# Patient Record
Sex: Male | Born: 1980 | Race: Black or African American | Hispanic: No | Marital: Single | Smoking: Never smoker
Health system: Southern US, Community
[De-identification: ages and names within clinical notes are randomized; demographics above are authoritative.]

## PROBLEM LIST (undated history)

## (undated) DIAGNOSIS — T7840XA Allergy, unspecified, initial encounter: Secondary | ICD-10-CM

## (undated) DIAGNOSIS — J45909 Unspecified asthma, uncomplicated: Secondary | ICD-10-CM

## (undated) DIAGNOSIS — D571 Sickle-cell disease without crisis: Secondary | ICD-10-CM

## (undated) HISTORY — DX: Allergy, unspecified, initial encounter: T78.40XA

## (undated) HISTORY — DX: Unspecified asthma, uncomplicated: J45.909

## (undated) HISTORY — DX: Sickle-cell disease without crisis: D57.1

---

## 2002-04-19 ENCOUNTER — Emergency Department (HOSPITAL_COMMUNITY): Admission: EM | Admit: 2002-04-19 | Discharge: 2002-04-19 | Payer: Self-pay | Admitting: Emergency Medicine

## 2002-05-16 ENCOUNTER — Emergency Department (HOSPITAL_COMMUNITY): Admission: EM | Admit: 2002-05-16 | Discharge: 2002-05-16 | Payer: Self-pay | Admitting: Emergency Medicine

## 2004-06-27 ENCOUNTER — Emergency Department (HOSPITAL_COMMUNITY): Admission: EM | Admit: 2004-06-27 | Discharge: 2004-06-27 | Payer: Self-pay | Admitting: Emergency Medicine

## 2006-01-01 ENCOUNTER — Inpatient Hospital Stay (HOSPITAL_COMMUNITY): Admission: EM | Admit: 2006-01-01 | Discharge: 2006-01-06 | Payer: Self-pay | Admitting: Emergency Medicine

## 2006-03-29 ENCOUNTER — Emergency Department (HOSPITAL_COMMUNITY): Admission: EM | Admit: 2006-03-29 | Discharge: 2006-03-29 | Payer: Self-pay | Admitting: Emergency Medicine

## 2006-05-28 ENCOUNTER — Inpatient Hospital Stay (HOSPITAL_COMMUNITY): Admission: EM | Admit: 2006-05-28 | Discharge: 2006-05-31 | Payer: Self-pay | Admitting: *Deleted

## 2006-08-06 ENCOUNTER — Emergency Department (HOSPITAL_COMMUNITY): Admission: EM | Admit: 2006-08-06 | Discharge: 2006-08-06 | Payer: Self-pay | Admitting: Emergency Medicine

## 2006-11-16 ENCOUNTER — Emergency Department (HOSPITAL_COMMUNITY): Admission: EM | Admit: 2006-11-16 | Discharge: 2006-11-16 | Payer: Self-pay | Admitting: Emergency Medicine

## 2006-11-26 ENCOUNTER — Emergency Department (HOSPITAL_COMMUNITY): Admission: EM | Admit: 2006-11-26 | Discharge: 2006-11-26 | Payer: Self-pay | Admitting: Emergency Medicine

## 2007-03-10 ENCOUNTER — Emergency Department (HOSPITAL_COMMUNITY): Admission: EM | Admit: 2007-03-10 | Discharge: 2007-03-10 | Payer: Self-pay | Admitting: Emergency Medicine

## 2007-08-29 IMAGING — CT CT PARANASAL SINUSES LIMITED
1 series · 16 of 24 positions shown, 20 images · non-contrast
Comparison: None available.

CLINICAL DATA: Pneumonia and Sickle cell crisis.  Evaluate for sinus disease.  
 LIMITED CT OF PARANASAL SINUSES:
TECHNIQUE: Limited coronal CT images were obtained through the paranasal sinuses without intravenous contrast.

[Series 3: ltd sinuses 3.0 h40s · axial · 0.29mm/px · z∈[+1212,+1315]mm · 16 of 24 slices shown, 20 images]
[im 2/24  brain]
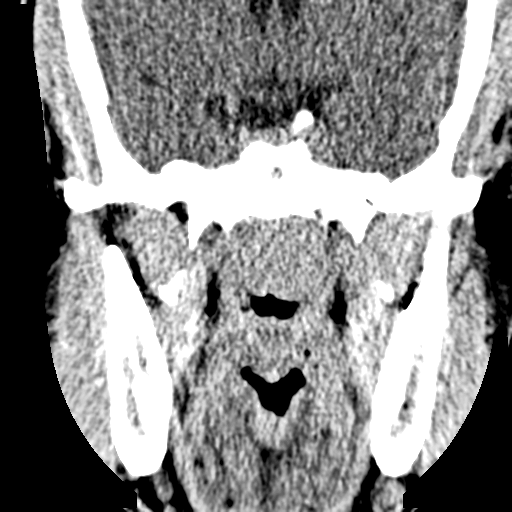
[im 2/24  bone]
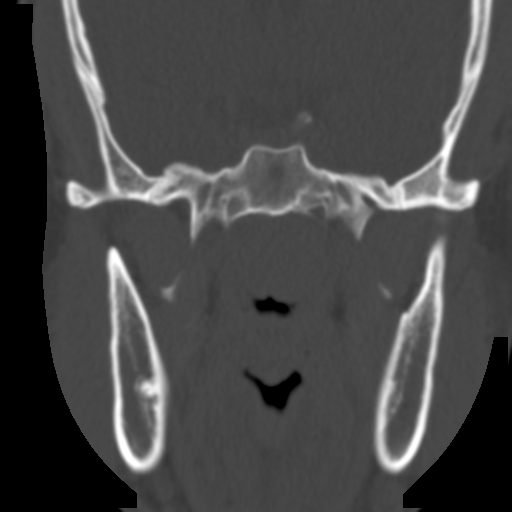
[im 4/24  bone]
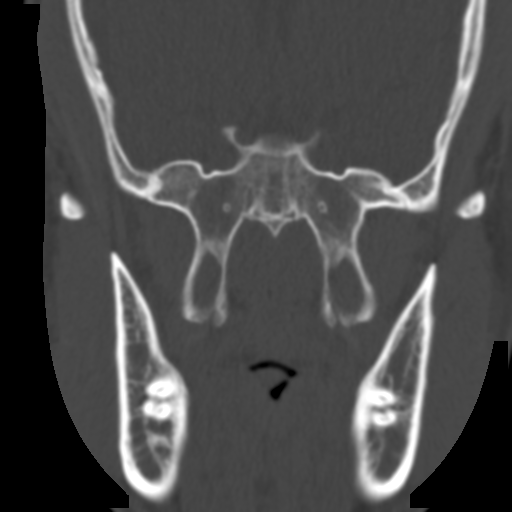
[im 5/24  bone]
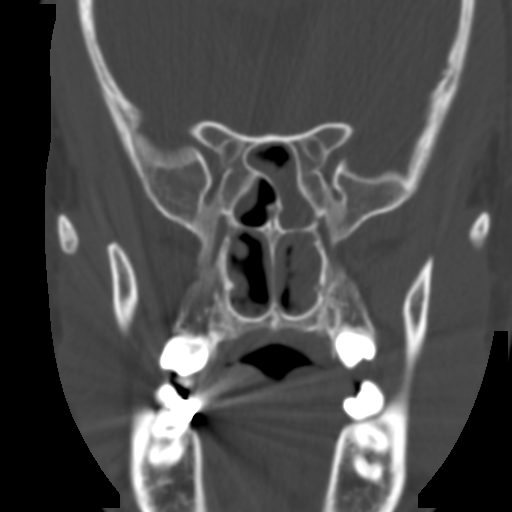
[im 6/24  bone]
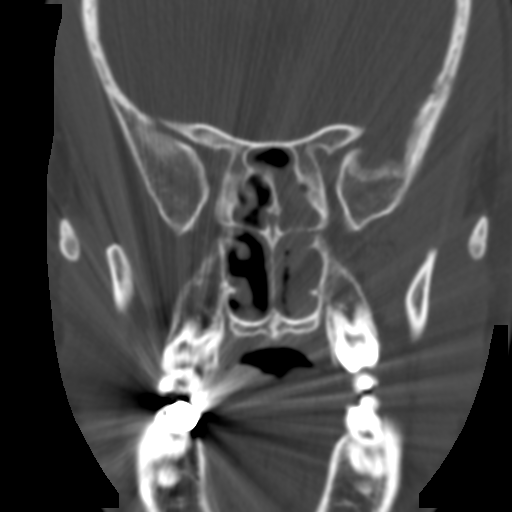
[im 8/24  brain]
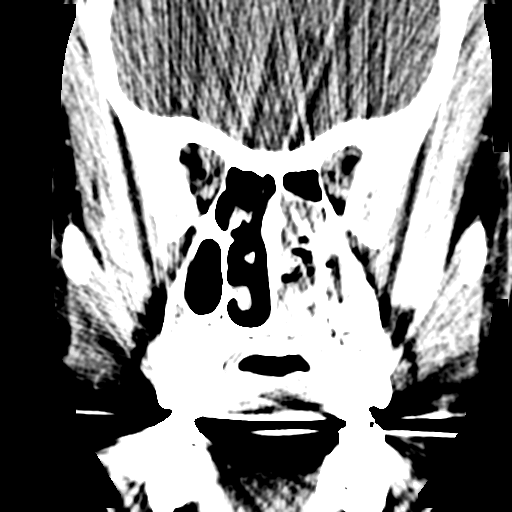
[im 8/24  bone]
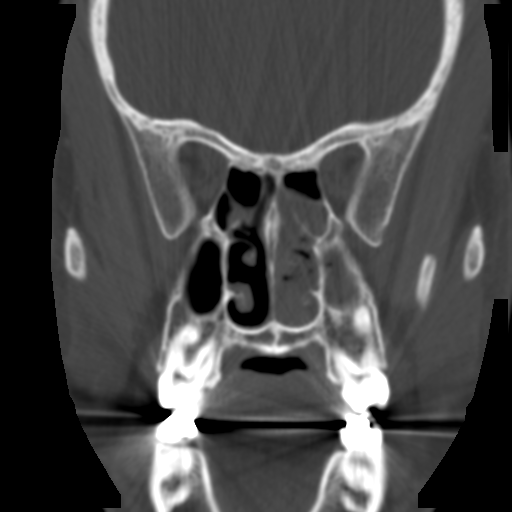
[im 9/24  bone]
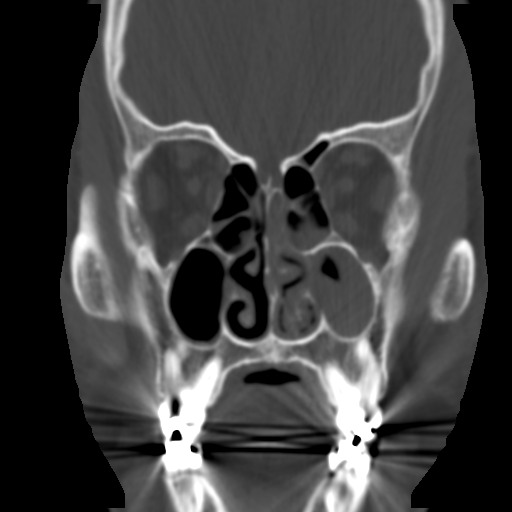
[im 10/24  bone]
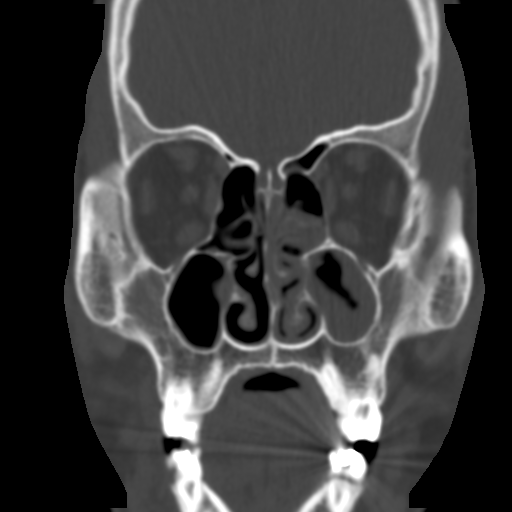
[im 12/24  bone]
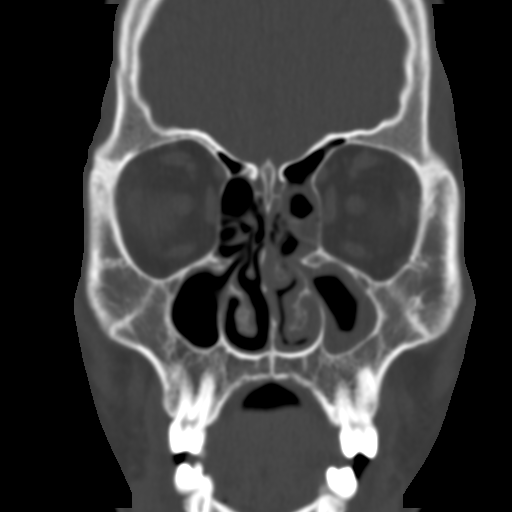
[im 13/24  brain]
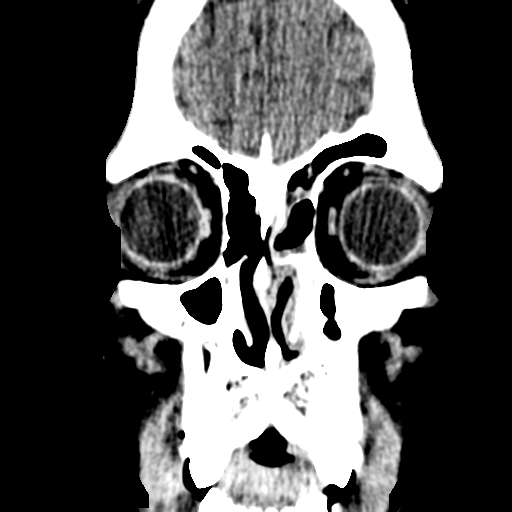
[im 13/24  bone]
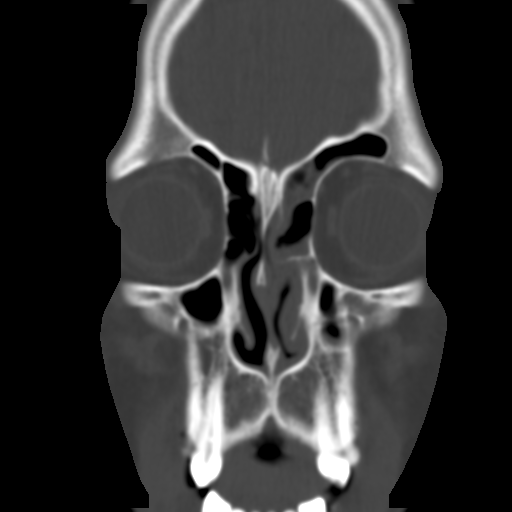
[im 15/24  bone]
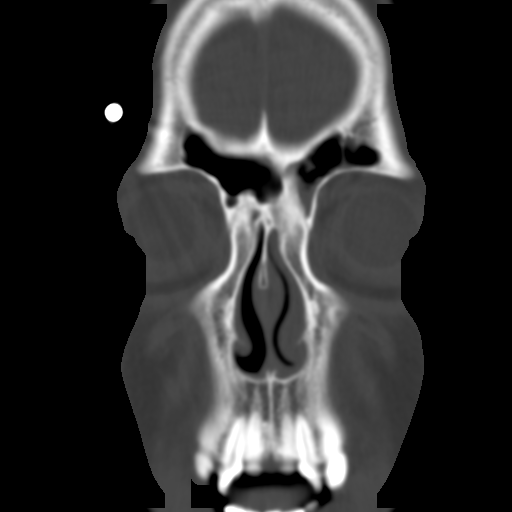
[im 16/24  bone]
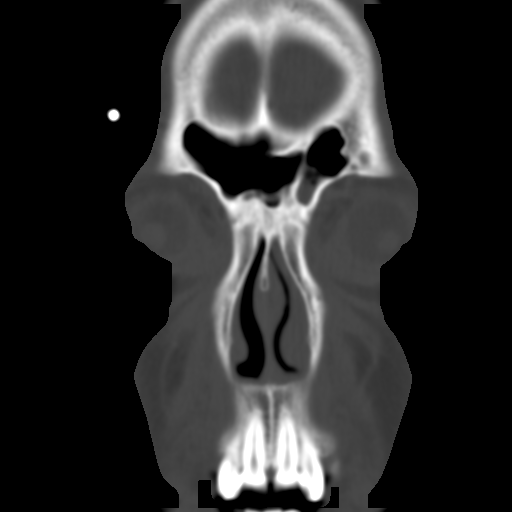
[im 17/24  bone]
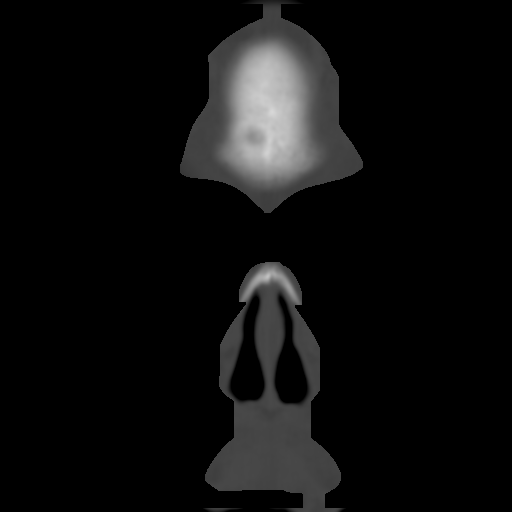
[im 19/24  brain]
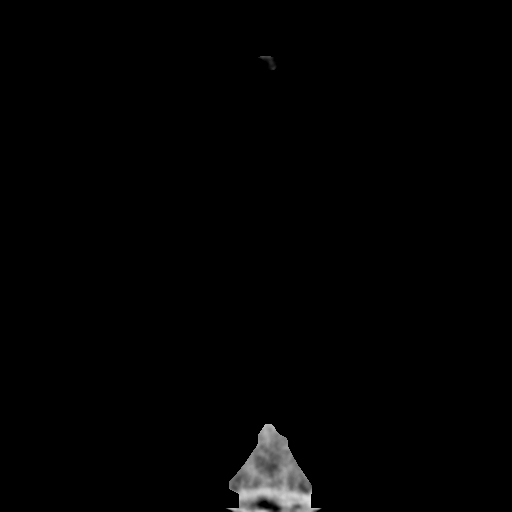
[im 19/24  bone]
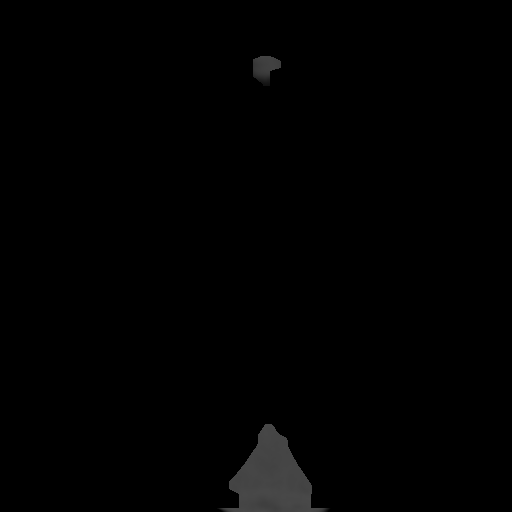
[im 20/24  bone]
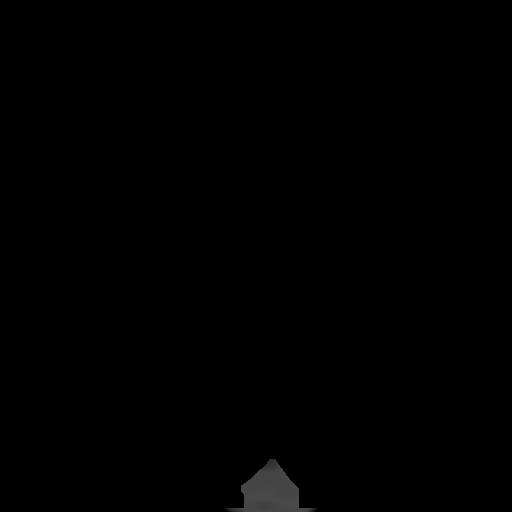
[im 21/24  bone]
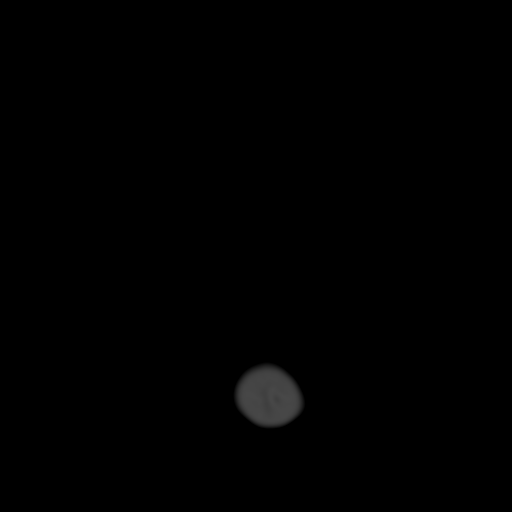
[im 23/24  bone]
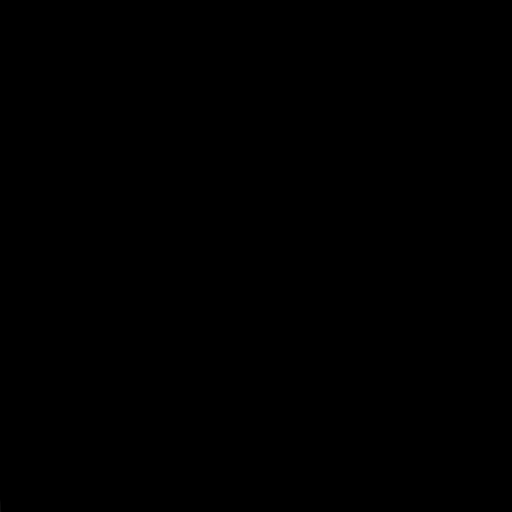

[16 of 24 positions shown; findings below may reference images not displayed]

FINDINGS: There is moderate to marked mucosal thickening involving the left maxillary sinus.  Mild mucosal thickening involves the right maxillary sinus.  There is partial opacification of the ethmoid air cells and left frontal sinuses.  The right osteal meatal unit is patent.  The left osteal meatal unit is occluded.
IMPRESSION: Moderate chronic appearing sinus disease.

## 2007-08-30 IMAGING — CR DG CHEST 2V
2 series · 2 of 2 positions shown · non-contrast
Comparison: 05/27/06.

CLINICAL DATA: Sickle cell crisis/chest congestion/followup pneumonia. 
 CHEST ? 2 VIEW:

[w chest pa *]
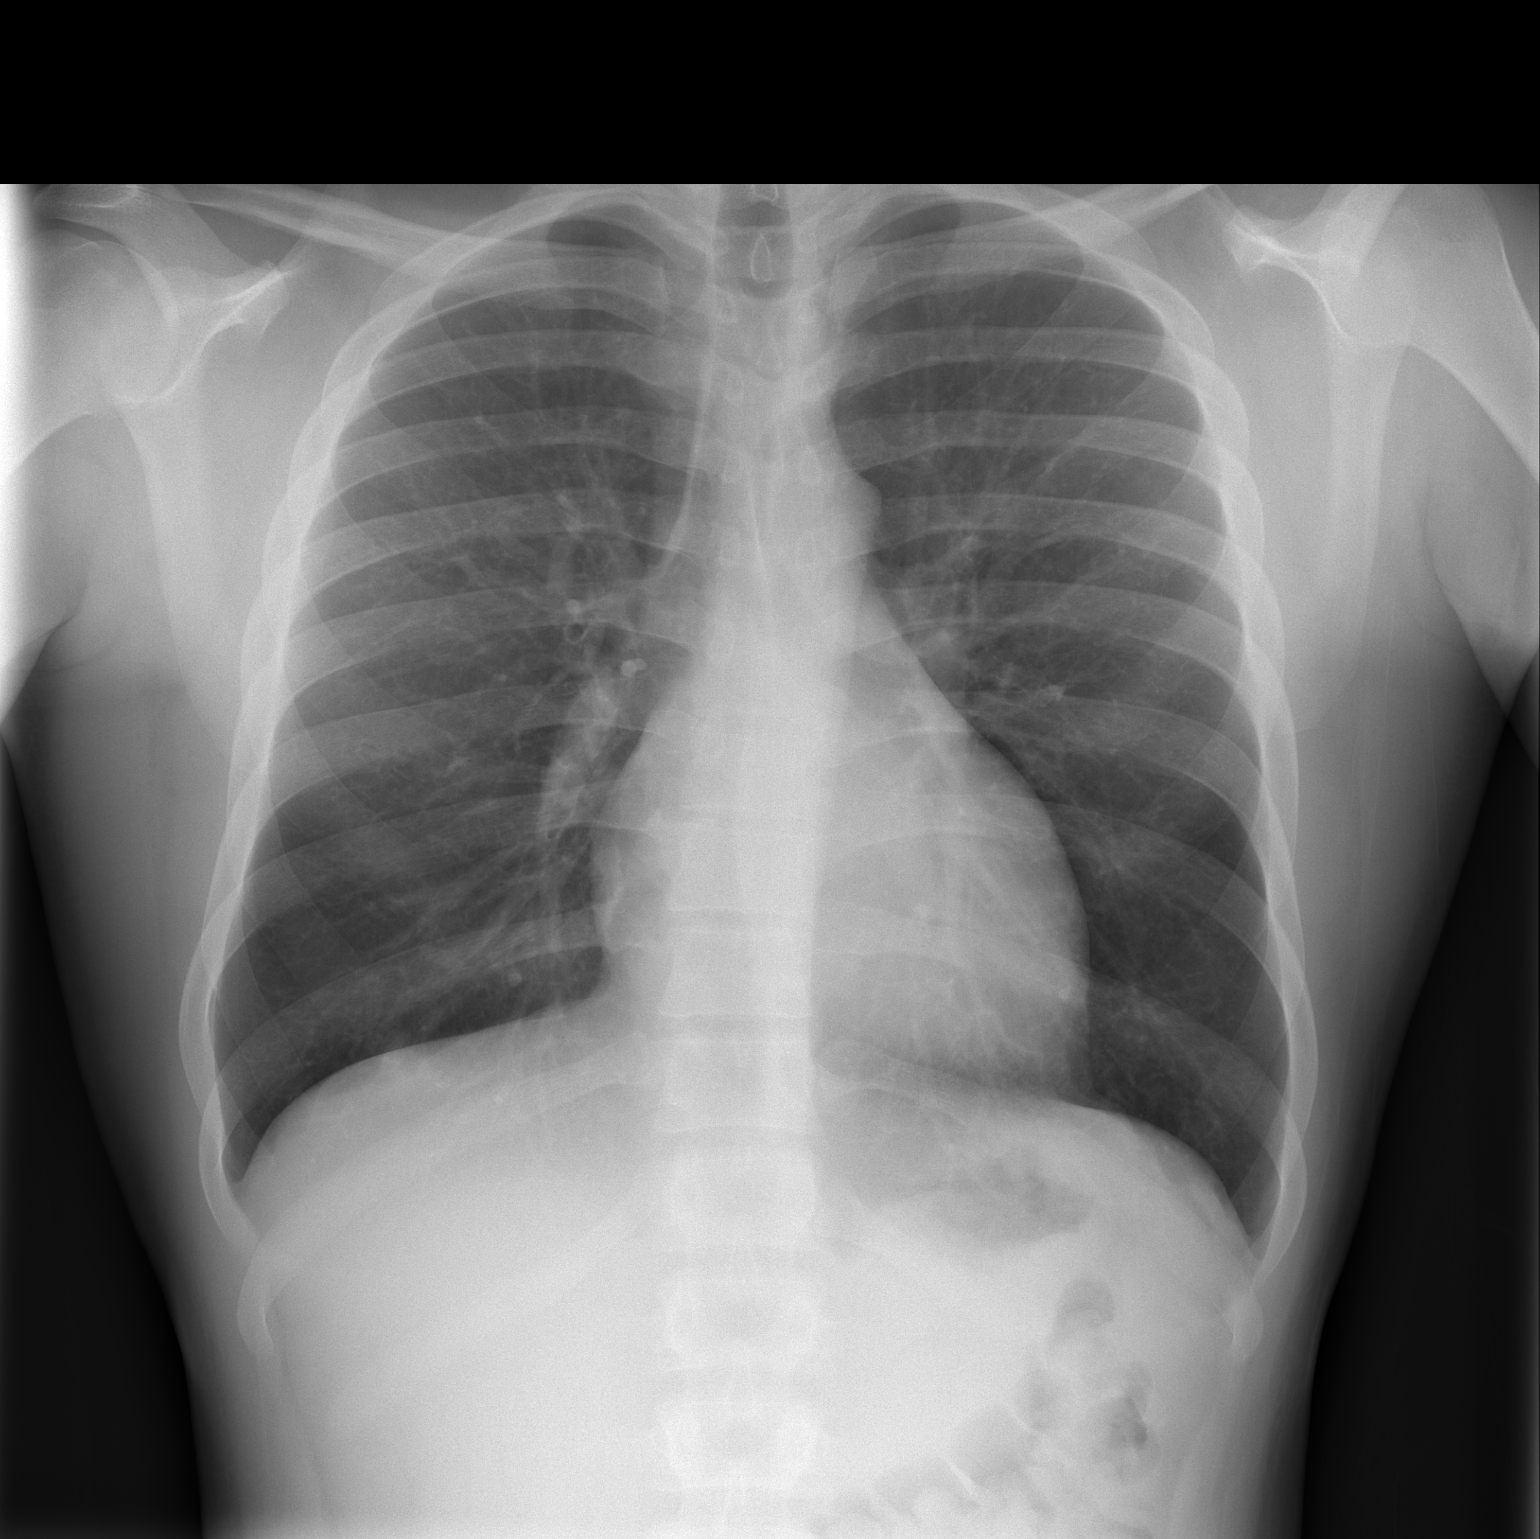

[w chest lat *]
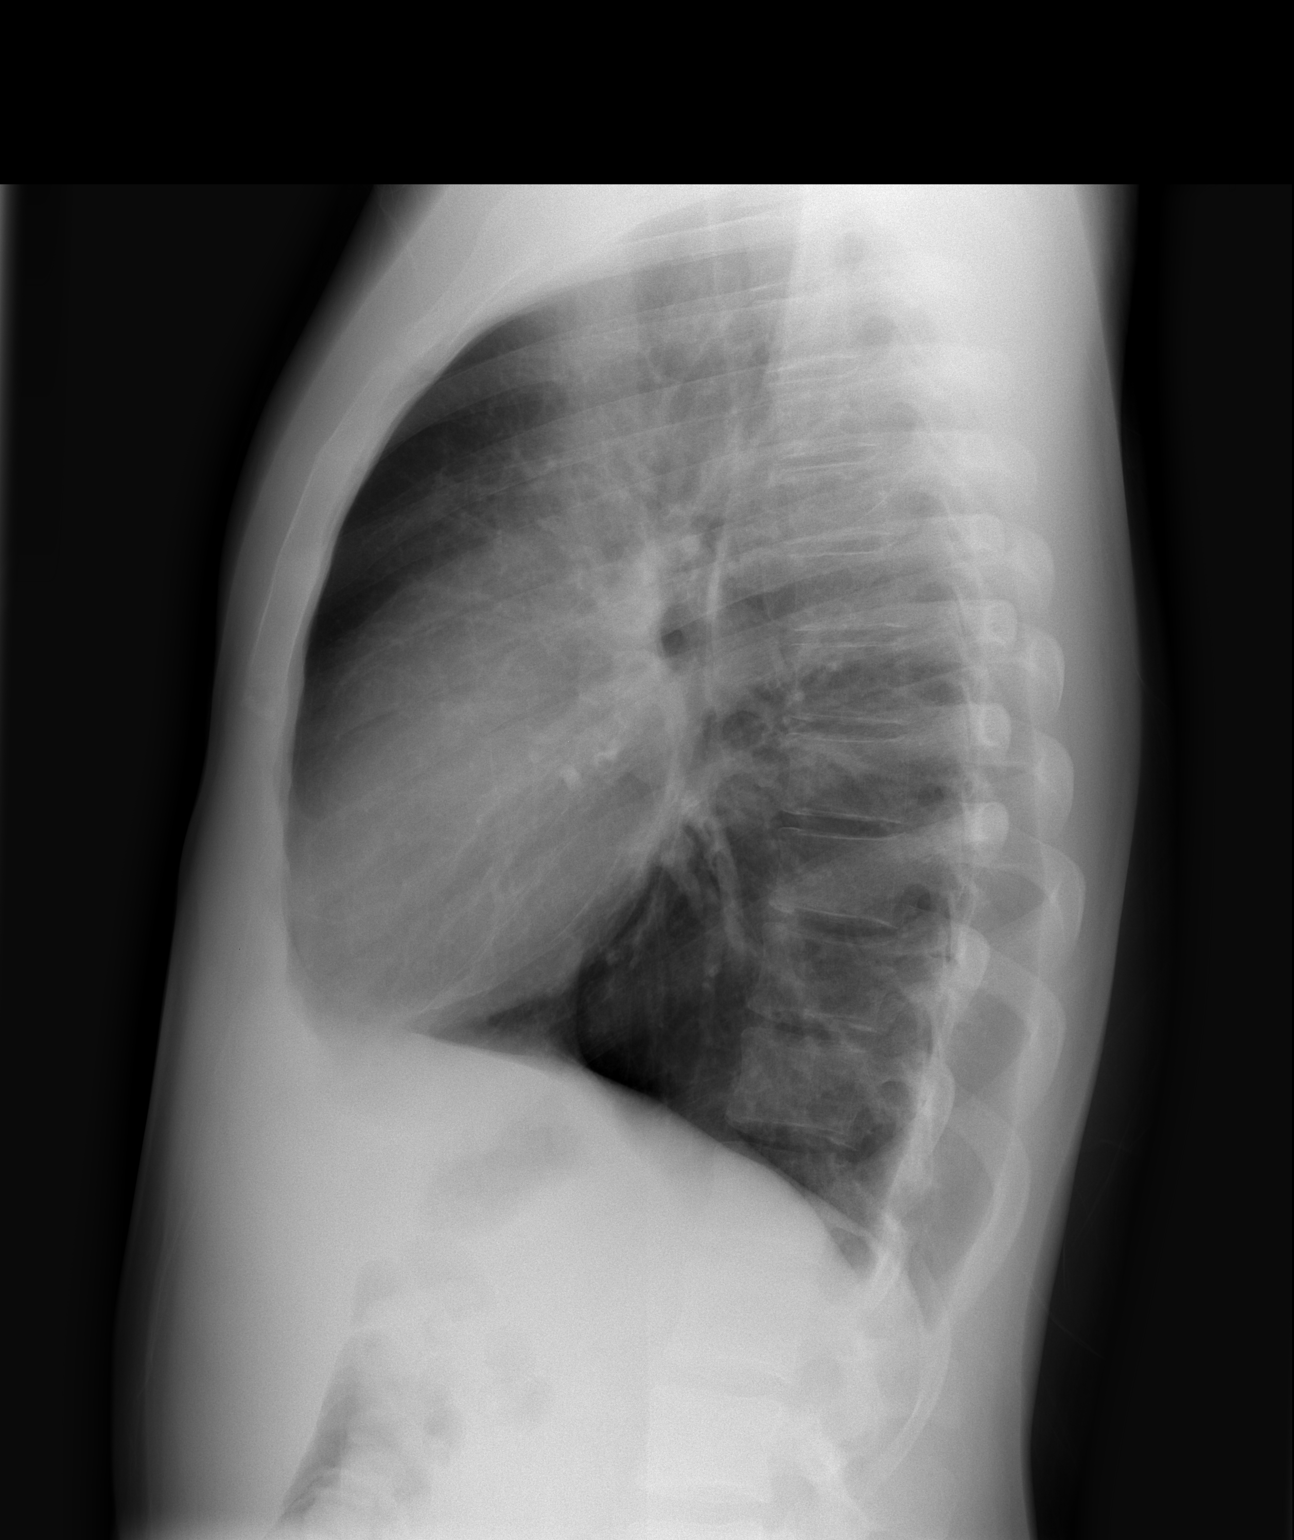

[2 of 2 positions shown; findings below may reference images not displayed]

FINDINGS: Heart and vascularity are normal.  The lungs are now clear with a right lower lobe airspace process completely resolved.  No pleural fluid.  Osseous structures intact.
IMPRESSION: Right lower lobe now clear ? no active disease.

## 2007-12-04 ENCOUNTER — Emergency Department (HOSPITAL_COMMUNITY): Admission: EM | Admit: 2007-12-04 | Discharge: 2007-12-04 | Payer: Self-pay | Admitting: Emergency Medicine

## 2008-05-04 ENCOUNTER — Emergency Department (HOSPITAL_COMMUNITY): Admission: EM | Admit: 2008-05-04 | Discharge: 2008-05-04 | Payer: Self-pay | Admitting: Emergency Medicine

## 2010-09-01 NOTE — Discharge Summary (Signed)
Lucas Guerrero, Lucas Guerrero              ACCOUNT NO.:  [id number]   MEDICAL RECORD NO.:  [id number]          PATIENT TYPE:  INP   LOCATION:  1329                         FACILITY:  [facility]   PHYSICIAN:  Isidor Holts, M.D.  DATE OF BIRTH:  08-22-1980   DATE OF ADMISSION:  01/01/2006  DATE OF DISCHARGE:  01/06/2006                                 DISCHARGE SUMMARY   PMD:  Unassigned.   DISCHARGE DIAGNOSES:  1. Newly diagnosed sickle cell disease.  2. Pain crisis.  3. Anemia.   DISCHARGE MEDICATIONS:  1. Folic acid 1 mg p.o. daily.  2. Tylenol with Codeine #3, 1-2 pills p.o. p.r.n. q.4-6h., a total of 56      pills have been dispensed.   PROCEDURES:  1. Chest x-ray dated January 01, 2006, that showed no acute      cardiopulmonary disease.  2. Chest CT angiogram dated January 01, 2006.  This was a negative CT      angiogram of the chest.   CONSULTATIONS:  Dr. August Saucer, Sickle Cell Medicine.   ADMISSION HISTORY:  As an H&P note of January 01, 2006, dictated by Dr.  [name].  However, in brief, this is a 30 year old male, with no  significant past medical history, who presents with a few days history of  pains in the chest, back, lower extremites, which have become progressive.  Initial laboratory evaluation demonstrated hemoglobin of 9.4, retic count  0.5%, RBC morphology showed sickle cells and polychromasia.  The patient had  no clear family history of sickle cell disease and was under the impression  that he had the sickle cell trait.  He was admitted for further evaluation,  investigation and management.   CLINICAL COURSE:  1. Sickle cell disease.  For details of presentation, refer to admission      history above.  Patient underwent HB electrophoresis which showed      hemoglobin A2 1.6, hemoglobin F markedly elevated at 36.6, hemoglobin S      61.8, hemoglobin A 0, other hemoglobin 0.  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  This may have been the reason he has such      a late presentation.  Be that as it may, he appeared to be in pain      crisis.  Septic workup was unremarkable.  Chest CT angiogram, done to      evaluate an elevated D-dimer, was negative for pulmonary embolism.  He      was managed with intravenous fluid hydration, folic acid      supplementation, scheduled analgesic medication, with satisfactory      clinical effect.  By January 06, 2006, patient was totally      asymptomatic and keen to go home.  Because of this new diagnosis/first      presentation with this condition, it was felt appropriate to invite Dr.      August Saucer, sickle cell disease specialist, for his input in this case.  He      has been quite helpful in the management, and has assured me that he      will follow patient on an outpatient basis in his office, following      discharge.   1. Anemia.  Secondary to #1 above.  Patient's hemoglobin remained stable      throughout this hospitalization and as of January 06, 2006,      hemoglobin was 8.7 with a hematocrit of 25.8. This is deemed to be      patient's baseline, for future reference.   DISPOSITION:  Patient was considered clinically stable, and sufficiently  recovered, to be discharged on January 06, 2006.  He is expected to return  to regular duties on January 08, 2006.   DIET:  No restrictions.   ACTIVITY:  As tolerated.   WOUND CARE:  Not applicable.   PAIN MANAGEMENT:  Refer to discharge medication list.   FOLLOW UP INSTRUCTIONS:  The patient has been instructed to follow up with  Dr. August Saucer within 1-2 weeks.  He has been supplied with the appropriate phone  #, i.e. [phone], and instructed to call, to establish an appointment.  Also, patient has been given appropriate information for [facility], case worker Maricela Curet, telephone # [phone].       Isidor Holts, M.D.  Electronically Signed     CO/MEDQ  D:  01/06/2006  T:  01/07/2006  Job:  [id number]   cc:   Minerva Areola L. August Saucer, M.D.  Fax: [phone]

## 2010-09-01 NOTE — Discharge Summary (Signed)
NAMEGARLEN, REINIG              ACCOUNT NO.:  [id number]   MEDICAL RECORD NO.:  [id number]          PATIENT TYPE:  INP   LOCATION:  1510                         FACILITY:  [facility]   PHYSICIAN:  Eric L. August Saucer, M.D.     DATE OF BIRTH:  1981/03/31   DATE OF ADMISSION:  05/27/2006  DATE OF DISCHARGE:  05/31/2006                               DISCHARGE SUMMARY   FINAL DIAGNOSES:  1. Pneumonia, probably aspiration.  2. Hemoglobin SF disease.  3. Chronic sinusitis.  4. Allergic rhinitis.   OPERATIONS AND PROCEDURES:  None   HISTORY OF PRESENT ILLNESS:  This is the second recent [facility] admission for this 30 year old single black male with  hemoglobin SF disease.  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  He had been doing fairly well  until approximately 2 weeks ago.  He noted at that time the onset of a  cold.  He describes sinus congestion with cough as well.  The patient  tried over the counter medications including TheraFlu without  significant improvement.  For two days prior to admission he had  increasing cough with weakness.  There was no chest pain.  He did  experience mild right upper shoulder pain.  On the day of admission his  symptoms became much more severe.  He subsequently went to the emergency  room as he was having difficulty breathing.  An evaluation at time there  revealed new onset of pneumonia  in the right lower lobe.  The patient  was admitted thereafter.  He notably was not having significant sickle  cell pain at that time.   HISTORY OF PRESENT ILLNESS:  Past medical history and physical exam is  per admission H&P.   HOSPITAL COURSE:  The patient was admitted for further treatment of new  onset of pneumonia.  X-ray demonstrated a right lower lobe air space  opacity.  He was started on IV fluids for hydration, IV Rocephin and  Zithromax for community-acquired pneumonia.  He was noted also to have  allergies  and was started on Singulair as well as nasal spray.  Over the  subsequent days he made slow but steady improvement.  He had only mild  pain associated with his sickle cell disease.  A CT scan was done on the  sinuses while hospitalized which demonstrated evidence for chronic sinus  disease.  It is felt that his pneumonia may have been secondary to  aspiration.   Notably a culture of his sputum showed only heavy normal flora.  With  continued therapy, the patient did improve.  A followup chest x-ray on  May 30, 2006 demonstrated clearing of the pneumonia.  He, on exam,  still had tenderness in the left maxillary versus the right maxillary  sinus region.  He was felt however to be stable for discharge.  He is  afebrile and ambulatory, feeling much better.  He was given instructions  on sinus care.  He will need possibly ENT followup thereafter.  Notably  peak flow done at the time of discharge shows  pretreatment value of 420,  with posttreatment of 460.   DISCHARGE MEDICATIONS:  Medications on discharge consisted of folate 2  mg p.o. daily, Singulair 10 mg daily, fluticasone spray 2 puffs daily,  Astelin nasal spray 2 puffs b.i.d., Avelox 400 mg daily for 6 days,  saline nasal irritation as needed, multivitamins daily, Symbicort 2  puffs b.i.d., Xopenex MDI two puffs q.i.d.Marland Kitchen   ACTIVITY:  No restrictions to activity as tolerated.   FOLLOWUP:  Follow up in the office in two weeks' time.           ______________________________  Lind Guest August Saucer, M.D.     ELD/MEDQ  D:  07/03/2006  T:  07/04/2006  Job:  [id number]

## 2010-09-01 NOTE — H&P (Signed)
NAMECLAIR, Lucas Guerrero              ACCOUNT NO.:  [id number]   MEDICAL RECORD NO.:  [id number]          PATIENT TYPE:  OBV   LOCATION:  1510                         FACILITY:  [facility]   PHYSICIAN:  Eric L. August Saucer, M.D.     DATE OF BIRTH:  10-Sep-1980   DATE OF ADMISSION:  05/27/2006  DATE OF DISCHARGE:                              HISTORY & PHYSICAL   CHIEF COMPLAINT:  Increased shortness of breath with cough, new onset  pneumonia.   HISTORY OF PRESENT ILLNESS:  This is just the second recent [facility] admission for this 30 year old single black male with  hemoglobin SF. He has hereditary persistence of fetal hemoglobin to an  extremely high level.  He had been doing fairly well until approximately  2 weeks ago.  He noted at that time that he developed a cold.  He  describes it as sinus congestion with cough.  The patient tried over-the-  counter medications including TheraFlu without significant improvement.  For the past 2 days, he had increasing cough with weakness.  There is no  chest pain.  He had had mild right upper shoulder pain.  Today his  symptoms became much more severe.  He subsequently went to the emergency  room as he had difficulty breathing.  Evaluation at that time did reveal  new onset of pneumonia in the right lower lobe.  The patient was  subsequently admitted for further evaluation.  He notably denies  significant symptoms associated with sickle cell crisis.  He has noted  mild shoulder pain which has been present for several days.   PAST MEDICAL HISTORY:  Notable for its first recent admission for sickle  cell crisis in September 2007.   During that hospital, his hemoglobin electrophoresis demonstrated  hemoglobin S of 61.8%, hemoglobin F at 36.6% and A2 of 1.6.  This  pattern has persisted as an outpatient as well.   Past medical history otherwise notable for allergic rhinitis.  No  documented diagnosis of asthma in the past.   CURRENT  MEDICATIONS:  Folate 2 mg n.p.o. daily.   ALLERGIES:  IBUPROFEN which causes vomiting.   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  He does  not smoke or drink.   PHYSICAL EXAM:  GENERAL:  He is a well-developed, well-nourished, black  male in no acute distress.  VITAL SIGNS:  Height 97.3.  Blood pressure 120/64, pulse of 78,  respiratory rate 20.  HEENT: Head normocephalic, atraumatic.  Extraocular muscles are intact.  He has bilateral turbinate edema, right greater than left __________  occlusion.  TMs with decreased light reflex bilaterally.  It was of note  that the patient had a mild jaundice to his scleral pigment noted.  NECK:  Supple. He has small positive cervical nodes in the left versus  right.  LUNGS:  Scattered rhonchi at bases.  Scattered __________  change in the  right base, worse in the left.  No rub appreciated.  Minimal wheeze.  CARDIOVASCULAR:  Normal S1-S2.  No S3, S4, murmurs or rubs.  ABDOMEN:  Bowel sounds  are present, no masses or tenderness.  EXTREMITIES:  Negative Homan's.  No edema.  Neurologic:  Intact.   LABORATORY DATA:  CBC revealed WBC of 16,700, hemoglobin 12.3,  hematocrit 36.  Absolute granulocyte count elevated at 12.4%.   Chemistries, sodium 140, potassium 3.6, chloride 106, CO2 29, BUN 5,  creatinine 0.060.  Glucose of 70.  Calcium 9.3. Retic count 6.1.   Chest x-ray demonstrates new right lower lobe, ill-defined, air space  opacity.  Differential consist of pneumonia and acute chest syndrome.   IMPRESSION:  1. Pneumonia, new onset.  2. Allergic rhinitis.  3. Atypical asthma.  4. Sickle cell anemia with impending crisis.  Recent infection would      put him at high risk for this at this time.   PLAN:  IV fluids for hydration.  IV Rocephin and Zithromax for the  community acquired. Will need to treat his allergies more aggressively  as well. Will add Singulair to his regimen as well as fluticasone nasal  spray.   Follow his progress over the next 24 hours.  Anticipate a short  hospital stay pending response to the above.           ______________________________  Lind Guest. August Saucer, M.D.     ELD/MEDQ  D:  05/27/2006  T:  05/28/2006  Job:  [id number]

## 2010-09-01 NOTE — H&P (Signed)
NAMEKHANI, PAINO              ACCOUNT NO.:  [id number]   MEDICAL RECORD NO.:  [id number]          PATIENT TYPE:  EMS   LOCATION:  ED                           FACILITY:  [facility]   PHYSICIAN:  Merlene Laughter. Renae Gloss, M.D.DATE OF BIRTH:  04-27-1980   DATE OF ADMISSION:  01/01/2006  DATE OF DISCHARGE:                                HISTORY & PHYSICAL   CHIEF COMPLAINT:  Hurting all over.   HISTORY OF PRESENT ILLNESS:  Mr. Lucas Guerrero is a 30 year old gentleman with a  reported history of sickle cell trait, who complains of chest, back, and  lower extremity pain that has progressed over the last several days.  He has  no outpatient pain regimen.  He denies nausea, vomiting, fever, chills, or  any other acute constitutional systemic complaints at this time.   PAST MEDICAL HISTORY:  Sickle cell trait, however, the patient is unsure  whether or not he has the trait or disease.   FAMILY HISTORY:  Significant for hypertension and diabetes in father.   SOCIAL HISTORY:  The patient denies tobacco or drugs of abuse.  He does have  occasional alcohol use.   ALLERGIES:  Drug allergies:  Significant for IBUPROFEN, which causes  vomiting.   MEDICATIONS:  He is currently on no medications.   REVIEW OF SYSTEMS:  As per HPI, otherwise negative.  Greater than 10 systems  were reviewed.   PHYSICAL EXAMINATION:  GENERAL APPEARANCE:  Well-developed, well-nourished,  young male, complaining of pain.  VITAL SIGNS:  Temperature 98.1, pulse 76, respirations 20, blood pressure  112/78.  HEENT:  TMs within normal limits bilaterally.  No oropharyngeal lesions.  NECK:  Supple.  No masses, 2+ carotids, no bruits.  LUNGS:  Clear to auscultation bilaterally.  HEART:  S1/S2.  Regular rate and rhythm.  No murmurs, rubs, or gallops.  ABDOMEN:  Soft, nontender, nondistended.  Positive bowel sounds.  EXTREMITIES:  No clubbing, cyanosis, or edema.  SKIN:  Warm, intact.  NEUROLOGIC:  Cranial nerves intact.  Alert and  oriented x3.   LABORATORY DATA:  Hemoglobin 9.4, retic count 0.5%, absolute retic 13.3.  BUN 5, creatinine 0.74, glucose 97.  WBC 16.8, RBC morphology shows [employer] bodies, sickle cells, and polychromasia.  CT/NGO is negative for  pulmonary embolism.   ASSESSMENT/PLAN:  Sickle cell crisis.  It is questionable whether or not Mr.  Borjon has sickle cell disease or the trait.  To confirm this diagnosis a  hemoglobin electrophoresis will be obtained.  He will be admitted, however,  for pain control with IV narcotics.  Folate will be initiated as well.           ______________________________  Merlene Laughter. Renae Gloss, M.D.     KRS/MEDQ  D:  01/01/2006  T:  01/01/2006  Job:  [id number]

## 2011-01-23 LAB — URINALYSIS, ROUTINE W REFLEX MICROSCOPIC
Bilirubin Urine: NEGATIVE
Glucose, UA: NEGATIVE
Hgb urine dipstick: NEGATIVE
Nitrite: NEGATIVE
Specific Gravity, Urine: 1.014
Urobilinogen, UA: 1
pH: 7.5

## 2011-01-23 LAB — RPR: RPR Ser Ql: NONREACTIVE

## 2011-01-23 LAB — URINE MICROSCOPIC-ADD ON

## 2013-12-14 ENCOUNTER — Ambulatory Visit
Admission: RE | Admit: 2013-12-14 | Discharge: 2013-12-14 | Disposition: A | Discharge status: Home / Self Care | Payer: BC Managed Care – PPO | Source: Ambulatory Visit | Attending: Allergy | Admitting: Allergy

## 2013-12-14 ENCOUNTER — Other Ambulatory Visit: Payer: Self-pay | Admitting: Allergy

## 2013-12-14 DIAGNOSIS — J45909 Unspecified asthma, uncomplicated: Secondary | ICD-10-CM

## 2014-09-14 ENCOUNTER — Ambulatory Visit (INDEPENDENT_AMBULATORY_CARE_PROVIDER_SITE_OTHER): Payer: BLUE CROSS/BLUE SHIELD | Admitting: Family Medicine

## 2014-09-14 VITALS — BP 110/78 | HR 60 | Temp 98.5°F | Resp 16 | Ht 67.5 in | Wt 162.4 lb

## 2014-09-14 DIAGNOSIS — L03012 Cellulitis of left finger: Secondary | ICD-10-CM | POA: Diagnosis not present

## 2014-09-14 DIAGNOSIS — M79645 Pain in left finger(s): Secondary | ICD-10-CM | POA: Diagnosis not present

## 2014-09-14 MED ORDER — HYDROCODONE-ACETAMINOPHEN 5-325 MG PO TABS: ORAL_TABLET | ORAL | Status: DC

## 2014-09-14 MED ORDER — SULFAMETHOXAZOLE-TRIMETHOPRIM 800-160 MG PO TABS: 1.0000 | ORAL_TABLET | Freq: Two times a day (BID) | ORAL | Status: DC

## 2014-09-14 NOTE — Progress Notes (Signed)
  Subjective:  Patient ID: Trish FountainJermaine Bedel, male    DOB: 09/11/1980  Age: 34 y.o. 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 He thinks he might a got a hair stuck under the nail or something. He has never had one of these before. He's allergic to any medicines. He is otherwise healthy.   Objective:   Swollen tender fluctuant paronychia of left third finger on the medial aspect.  Procedure note This was numbed with 1% lidocaine. Incision was made and pus expressed. Pressure dressing was applied  Assessment & Plan:   Assessment: Finger pain Paronychia middle finger left hand  Plan: Patient Instructions  Take the sulfamethoxazole one twice daily for infection  Take the hydrocodone pain pills every 4 hours if needed for pain  Keep it clean and dry. I would wear a good Band-Aid on it over the next week.      Joelene Barriere, MD 09/14/2014

## 2014-09-14 NOTE — Patient Instructions (Signed)
Take the sulfamethoxazole one twice daily for infection  Take the hydrocodone pain pills every 4 hours if needed for pain  Keep it clean and dry. I would wear a good Band-Aid on it over the next week.

## 2014-09-17 LAB — WOUND CULTURE
GRAM STAIN: NONE SEEN
GRAM STAIN: NONE SEEN
Organism ID, Bacteria: NO GROWTH

## 2015-04-14 ENCOUNTER — Ambulatory Visit (INDEPENDENT_AMBULATORY_CARE_PROVIDER_SITE_OTHER): Payer: BLUE CROSS/BLUE SHIELD | Admitting: Family Medicine

## 2015-04-14 VITALS — BP 127/69 | HR 71 | Temp 98.3°F | Resp 16 | Ht 64.5 in | Wt 159.4 lb

## 2015-04-14 DIAGNOSIS — R05 Cough: Secondary | ICD-10-CM | POA: Diagnosis not present

## 2015-04-14 DIAGNOSIS — J029 Acute pharyngitis, unspecified: Secondary | ICD-10-CM

## 2015-04-14 DIAGNOSIS — J209 Acute bronchitis, unspecified: Secondary | ICD-10-CM

## 2015-04-14 DIAGNOSIS — R059 Cough, unspecified: Secondary | ICD-10-CM

## 2015-04-14 LAB — POCT RAPID STREP A (OFFICE): Rapid Strep A Screen: NEGATIVE

## 2015-04-14 MED ORDER — BENZONATATE 200 MG PO CAPS: 200.0000 mg | ORAL_CAPSULE | Freq: Two times a day (BID) | ORAL | Status: AC | PRN

## 2015-04-14 MED ORDER — HYDROCODONE-HOMATROPINE 5-1.5 MG/5ML PO SYRP: 5.0000 mL | ORAL_SOLUTION | Freq: Every evening | ORAL | Status: AC | PRN

## 2015-04-14 MED ORDER — AMOXICILLIN-POT CLAVULANATE 875-125 MG PO TABS: 1.0000 | ORAL_TABLET | Freq: Two times a day (BID) | ORAL | Status: AC

## 2015-04-14 NOTE — Progress Notes (Signed)
Chief Complaint:  Chief Complaint  Patient presents with  . Cough    green sputum - congestion  x 1 week  . Laryngitis    HPI: Lucas Guerrero is a 34 y.o. male [facility] complaining of cough, productive green and running down his chest and causing chest congestion and cough and laryngitis x 1 week. He has no other sxs.  Denies f/c/n/v//abdpain/diarrhea/rashes. HE has allergy and also asthma, is afraid will get worse due to his illnesses.   Past Medical History  Diagnosis Date  . Allergy   . Asthma   . Sickle cell anemia (HCC)    No past surgical history on file. Social History   Social History  . Marital Status: Single    Spouse Name: N/A  . Number of Children: N/A  . Years of Education: N/A   Social History Main Topics  . Smoking status: Never Smoker   . Smokeless tobacco: Never Used  . Alcohol Use: 0.0 oz/week    0 Standard drinks or equivalent per week     Comment: social use  . Drug Use: No  . Sexual Activity: Not Asked   Other Topics Concern  . None   Social History Narrative   Family History  Problem Relation Age of Onset  . Diabetes Father    No Known Allergies Prior to Admission medications   Medication Sig Start Date End Date Taking? Authorizing Provider  albuterol (PROVENTIL HFA;VENTOLIN HFA) 108 (90 BASE) MCG/ACT inhaler Inhale 2 puffs into the lungs every 6 (six) hours as needed for wheezing or shortness of breath.   Yes Historical Provider, MD  fluticasone (FLONASE) 50 MCG/ACT nasal spray Place 2 sprays into both nostrils daily.   Yes Historical Provider, MD  montelukast (SINGULAIR) 10 MG tablet Take 10 mg by mouth at bedtime.   Yes Historical Provider, MD     ROS: The patient denies fevers, chills, night sweats, unintentional weight loss, chest pain, palpitations, wheezing, dyspnea on exertion, nausea, vomiting, abdominal pain, dysuria, hematuria, melena, numbness, weakness, or tingling.   All other systems have been  reviewed and were otherwise negative with the exception of those mentioned in the HPI and as above.    PHYSICAL EXAM: Filed Vitals:   04/14/15 2051  BP: 127/69  Pulse: 71  Temp: 98.3 F (36.8 C)  Resp: 16   Body mass index is 26.95 kg/(m^2).   General: Alert, no acute distress HEENT:  Normocephalic, atraumatic, oropharynx patent. EOMI, PERRLA Erythematous throat, no exudates, TM normal, + sinus tenderness, + erythematous/boggy nasal mucosa Cardiovascular:  Regular rate and rhythm, no rubs murmurs or gallops.  No Carotid bruits, radial pulse intact. No pedal edema.  Respiratory: Clear to auscultation bilaterally.  No wheezes, rales, or rhonchi.  No cyanosis, no use of accessory musculature Abdominal: No organomegaly, abdomen is soft and non-tender, positive bowel sounds. No masses. Skin: No rashes. Neurologic: Facial musculature symmetric. Psychiatric: Patient acts appropriately throughout our interaction. Lymphatic: No cervical or submandibular lymphadenopathy Musculoskeletal: Gait intact. No edema, tenderness   LABS: Results for orders placed or performed in visit on 04/14/15  POCT rapid strep A  Result Value Ref Range   Rapid Strep A Screen Negative Negative     EKG/XRAY:   Primary read [facility].   ASSESSMENT/PLAN: Encounter Diagnoses  Name Primary?  . Acute pharyngitis, unspecified pharyngitis type Yes  . Acute bronchitis, unspecified organism   . Cough  Throat cx pending Rx Augmentin, tessalon perles, hycodan  Fu prn  Gross sideeffects, risk and benefits, and alternatives of medications d/w patient. Patient is aware that all medications have potential sideeffects and we are unable to predict every sideeffect or drug-drug interaction that may occur.  Reyanne Hussar DO  04/14/2015 10:06 PM

## 2015-04-16 LAB — CULTURE, GROUP A STREP: Organism ID, Bacteria: NORMAL

## 2016-12-22 ENCOUNTER — Encounter (HOSPITAL_COMMUNITY): Payer: Self-pay

## 2016-12-22 ENCOUNTER — Emergency Department (HOSPITAL_COMMUNITY)
Admission: EM | Admit: 2016-12-22 | Discharge: 2016-12-23 | Disposition: A | Discharge status: Home / Self Care | Payer: BLUE CROSS/BLUE SHIELD | Attending: Emergency Medicine | Admitting: Emergency Medicine

## 2016-12-22 DIAGNOSIS — J45909 Unspecified asthma, uncomplicated: Secondary | ICD-10-CM | POA: Insufficient documentation

## 2016-12-22 DIAGNOSIS — R1031 Right lower quadrant pain: Secondary | ICD-10-CM

## 2016-12-22 DIAGNOSIS — Z79899 Other long term (current) drug therapy: Secondary | ICD-10-CM | POA: Insufficient documentation

## 2016-12-22 DIAGNOSIS — R1030 Lower abdominal pain, unspecified: Secondary | ICD-10-CM | POA: Insufficient documentation

## 2016-12-22 MED ORDER — OXYCODONE HCL 5 MG PO TABS
5.0000 mg | ORAL_TABLET | Freq: Once | ORAL | Status: AC
  Administered 2016-12-22: 5 mg via ORAL
  Filled 2016-12-22: qty 1

## 2016-12-22 MED ORDER — ACETAMINOPHEN 500 MG PO TABS
1000.0000 mg | ORAL_TABLET | Freq: Once | ORAL | Status: AC
  Administered 2016-12-22: 1000 mg via ORAL
  Filled 2016-12-22: qty 2

## 2016-12-22 MED ORDER — KETOROLAC TROMETHAMINE 60 MG/2ML IM SOLN
15.0000 mg | Freq: Once | INTRAMUSCULAR | Status: AC
  Administered 2016-12-22: 15 mg via INTRAMUSCULAR
  Filled 2016-12-22: qty 2

## 2016-12-22 NOTE — ED Provider Notes (Signed)
[facility]-EMERGENCY DEPT Provider Note   CSN: [id number] Arrival date & time: 12/22/16  2140     History   Chief Complaint Chief Complaint  Patient presents with  . Groin Pain    HPI Lucas Guerrero is a 36 y.o. male.  36 yo M with a chief complaint of right groin pain. This been going on for the past couple weeks. Worse with movement bending twisting. Patient denies any injury denies heavy lifting. Denies mass. Denies testicular or penile pain. Denies risky sexual scenarios. Denies penile discharge or inguinal rash. Denies trauma. Symptoms seem to improve with urination. Nothing else seems to make it better.   The history is provided by the patient.  Groin Pain  This is a new problem. The current episode started more than 1 week ago. The problem occurs constantly. The problem has been gradually worsening. Pertinent negatives include no chest pain, no abdominal pain, no headaches and no shortness of breath. The symptoms are aggravated by walking, bending and twisting. Nothing relieves the symptoms. He has tried nothing for the symptoms. The treatment provided no relief.    Past Medical History:  Diagnosis Date  . Allergy   . Asthma   . Sickle cell anemia (HCC)     There are no active problems to display for this patient.   History reviewed. No pertinent surgical history.     Home Medications    Prior to Admission medications   Medication Sig Start Date End Date Taking? Authorizing Provider  albuterol (PROVENTIL HFA;VENTOLIN HFA) 108 (90 BASE) MCG/ACT inhaler Inhale 2 puffs into the lungs every 6 (six) hours as needed for wheezing or shortness of breath.    [provider]  amoxicillin-clavulanate (AUGMENTIN) 875-125 MG tablet Take 1 tablet by mouth 2 (two) times daily. 04/14/15   Le, Thao P, DO  benzonatate (TESSALON) 200 MG capsule Take 1 capsule (200 mg total) by mouth 2 (two) times daily as needed for cough. 04/14/15   Le, Thao P, DO  fluticasone (FLONASE) 50  MCG/ACT nasal spray Place 2 sprays into both nostrils daily.    [provider]  HYDROcodone-homatropine (HYCODAN) 5-1.5 MG/5ML syrup Take 5 mLs by mouth at bedtime as needed. 04/14/15   Le, Thao P, DO  montelukast (SINGULAIR) 10 MG tablet Take 10 mg by mouth at bedtime.    [provider]    Family History Family History  Problem Relation Age of Onset  . Diabetes Father     Social History Social History  Substance Use Topics  . Smoking status: Never Smoker  . Smokeless tobacco: Never Used  . Alcohol use 0.0 oz/week     Comment: social use     Allergies   Patient has no known allergies.   Review of Systems Review of Systems  Constitutional: Negative for chills and fever.  HENT: Negative for congestion and facial swelling.   Eyes: Negative for discharge and visual disturbance.  Respiratory: Negative for shortness of breath.   Cardiovascular: Negative for chest pain and palpitations.  Gastrointestinal: Negative for abdominal pain, diarrhea and vomiting.  Genitourinary: Negative for penile pain, penile swelling and scrotal swelling.       Groin pain  Musculoskeletal: Negative for arthralgias and myalgias.  Skin: Negative for color change and rash.  Neurological: Negative for tremors, syncope and headaches.  Psychiatric/Behavioral: Negative for confusion and dysphoric mood.     Physical Exam Updated Vital Signs BP (!) 115/48 (BP Location: Right Arm)   Pulse (!) 42  Temp 98 F (36.7 C) (Oral)   Resp 18   SpO2 98%   Physical Exam  Constitutional: He is oriented to person, place, and time. He appears well-developed and well-nourished.  HENT:  Head: Normocephalic and atraumatic.  Eyes: Pupils are equal, round, and reactive to light. EOM are normal.  Neck: Normal range of motion. Neck supple. No JVD present.  Cardiovascular: Normal rate and regular rhythm.  Exam reveals no gallop and no friction rub.   No murmur heard. Pulmonary/Chest: No  respiratory distress. He has no wheezes.  Abdominal: He exhibits no distension and no mass. There is no tenderness. There is no rebound and no guarding.  Genitourinary:     Musculoskeletal: Normal range of motion.  Neurological: He is alert and oriented to person, place, and time.  Skin: No rash noted. No pallor.  Psychiatric: He has a normal mood and affect. His behavior is normal.  Nursing note and vitals reviewed.    ED Treatments / Results  Labs (all labs ordered are listed, but only abnormal results are displayed) Labs Reviewed  URINALYSIS, ROUTINE W REFLEX MICROSCOPIC    EKG  EKG Interpretation None       Radiology No results found.  Procedures Procedures (including critical care time)  Medications Ordered in ED Medications  ketorolac (TORADOL) injection 15 mg (15 mg Intramuscular Given 12/22/16 2358)  acetaminophen (TYLENOL) tablet 1,000 mg (1,000 mg Oral Given 12/22/16 2357)  oxyCODONE (Oxy IR/ROXICODONE) immediate release tablet 5 mg (5 mg Oral Given 12/22/16 2358)     Initial Impression / Assessment and Plan / ED Course  I have reviewed the triage vital signs and the nursing notes.  Pertinent labs & imaging results that were available during my care of the patient were reviewed by me and considered in my medical decision making (see chart for details).     36 yo M With right-sided groin pain. Think this is likely a muscular strain or pain from an inguinal hernia. There is no mass felt. There is a very small defect. With urinary association with obtain a UA.  UA negative.  D/c home, gen surgery follow up.    I have discussed the diagnosis/risks/treatment options with the patient and family and believe the pt to be eligible for discharge home to follow-up with PCP. We also discussed returning to the ED immediately if new or worsening sx occur. We discussed the sx which are most concerning (e.g., sudden worsening pain, fever, inability to tolerate by mouth) that  necessitate immediate return. Medications administered to the patient during their visit and any new prescriptions provided to the patient are listed below.  Medications given during this visit Medications  ketorolac (TORADOL) injection 15 mg (15 mg Intramuscular Given 12/22/16 2358)  acetaminophen (TYLENOL) tablet 1,000 mg (1,000 mg Oral Given 12/22/16 2357)  oxyCODONE (Oxy IR/ROXICODONE) immediate release tablet 5 mg (5 mg Oral Given 12/22/16 2358)     The patient appears reasonably screen and/or stabilized for discharge and I doubt any other medical condition or other [facility] requiring further screening, evaluation, or treatment in the ED at this time prior to discharge.    Final Clinical Impressions(s) / ED Diagnoses   Final diagnoses:  Right inguinal pain    New Prescriptions Discharge Medication List as of 12/23/2016 12:20 AM       Melene Plan, DO 12/23/16 0601

## 2016-12-22 NOTE — ED Triage Notes (Signed)
Pt complains of right groin pain for about a week Pt denies and swelling but says he has the urgency to urinate and that provides relief

## 2016-12-22 NOTE — ED Notes (Signed)
Bed: WA06 Expected date:  Expected time:  Means of arrival:  Comments: 

## 2016-12-23 LAB — URINALYSIS, ROUTINE W REFLEX MICROSCOPIC
Bilirubin Urine: NEGATIVE
Glucose, UA: NEGATIVE mg/dL
Hgb urine dipstick: NEGATIVE
KETONES UR: NEGATIVE mg/dL
LEUKOCYTES UA: NEGATIVE
NITRITE: NEGATIVE
PROTEIN: NEGATIVE mg/dL
Specific Gravity, Urine: 1.012 (ref 1.005–1.030)
pH: 6 (ref 5.0–8.0)

## 2016-12-23 NOTE — Discharge Instructions (Signed)
Take 4 over the counter ibuprofen tablets 3 times a day or 2 over-the-counter naproxen tablets twice a day for pain. Also take tylenol 1000mg(2 extra strength) four times a day.
